# Patient Record
Sex: Female | Born: 1995 | Race: White | Hispanic: No | Marital: Married
Health system: Midwestern US, Community
[De-identification: ages and names within clinical notes are randomized; demographics above are authoritative.]

## PROBLEM LIST (undated history)

## (undated) DIAGNOSIS — F319 Bipolar disorder, unspecified: Secondary | ICD-10-CM

## (undated) DIAGNOSIS — F419 Anxiety disorder, unspecified: Secondary | ICD-10-CM

## (undated) DIAGNOSIS — T7840XA Allergy, unspecified, initial encounter: Secondary | ICD-10-CM

## (undated) DIAGNOSIS — R51 Headache: Secondary | ICD-10-CM

## (undated) HISTORY — PX: OVUM / OOCYTE RETRIEVAL: SUR1269

## (undated) HISTORY — PX: EYE SURGERY: SHX253

## (undated) NOTE — ED Provider Notes (Signed)
 Associated Order(s): Wound Closure by Adhesive; Nerve Block Formatting of this note is different from the original. Emergency Department Treatment Report  Patient: Samantha Hoffman Age: 32 y.o. Sex: female   Date of Birth: 22-Dec-1995 Admit Date: 05/03/2020 PCP: None  MRN: [id number]  CSN: [id number]    Room: PIT/PIT Time Dictated: 2:35 PM    Payor: TRICARE / Plan: [facility] [place] / Product Type: [employer] /   Chief Complaint  Chief Complaint  Patient presents with  ? Laceration   History of Present Illness  37 y.o. female otherwise healthy RIGHT hand dominant  who presents to the ER she presents to the ER status post seizure presents to the ER status post right fifth finger laceration after cutting sweet potatoes her dogs.  She is up-to-date on tetanus.  She is neurovascularly intact and sensation intact to light touch on exam.  She states symptoms have improved since onset as she currently has had controlling her bleeding.  She denies taking any medication for treatment of her symptoms prior to arrival.  Patient denies any alleviating or exacerbating factors for her symptoms.  We will give 5 mg of oxycodone, obtain x-rays of the finger in the right hand, perform a digital nerve block, explore and irrigate the wound to ensure the tendon has not been lacerated, and sutured the wound with appropriate follow-up.  Review of Systems  Review of Systems  Constitutional: Negative for chills and fever.  HENT: Negative for congestion and sore throat.   Eyes: Negative for double vision and photophobia.  Respiratory: Negative for cough, shortness of breath and wheezing.   Cardiovascular: Negative for chest pain, palpitations and leg swelling.  Gastrointestinal: Negative for abdominal pain, diarrhea, nausea and vomiting.  Genitourinary: Negative for dysuria, frequency and urgency.  Musculoskeletal: Negative for myalgias and neck pain.  Skin: Negative for rash.  Neurological: Negative for  dizziness, speech change, loss of consciousness and weakness.   Past Medical/Surgical History  None reported past medical history No past surgical history on file.  Social History   Social History   Socioeconomic History  ? Marital status: MARRIED   Denies illicit drug usage  Family History  No family history on file.  Current Medications   None   Allergies   Allergies  Allergen Reactions  ? Azithromycin Hives and Shortness of Breath  ? Bee Venom Protein (Honey Bee) Angioedema  ? Doxycycline Hives and Shortness of Breath  ? Penicillin V Hives and Shortness of Breath   Physical Exam   ED Triage Vitals [05/03/20 1639]  ED Encounter Vitals Group     BP (!) 131/94     Pulse (Heart Rate) 95     Resp Rate 18     Temp 97.2 F (36.2 C)     Temp src      O2 Sat (%) 99 %     Weight 235 lb     Height 5' 10   I have reviewed documented vital signs, which are within age-appropriate parameters.  I have reviewed nursing documentation.  Physical Exam Constitutional:      General: She is not in acute distress.    Appearance: Normal appearance. She is not ill-appearing or diaphoretic.  HENT:     Head: Normocephalic and atraumatic.     Nose: No congestion or rhinorrhea.     Mouth/Throat:     Mouth: Mucous membranes are moist.     Pharynx: No oropharyngeal exudate or posterior oropharyngeal erythema.  Eyes:  Extraocular Movements: Extraocular movements intact.  Cardiovascular:     Rate and Rhythm: Normal rate and regular rhythm.     Heart sounds: No murmur heard. No friction rub. No gallop.   Pulmonary:     Effort: No respiratory distress.     Breath sounds: No wheezing, rhonchi or rales.  Abdominal:     Palpations: Abdomen is soft.     Tenderness: There is no abdominal tenderness. There is no guarding or rebound.  Musculoskeletal:        General: Swelling, tenderness, deformity and signs of injury present.     Cervical back: Normal range of motion.      Comments: Right fifth finger laceration approximately 2 cm in length along the long axis of the right fifth finger ulnar side.  Skin:    General: Skin is warm and dry.     Findings: No rash.  Neurological:     General: No focal deficit present.     Mental Status: She is alert and oriented to person, place, and time. Mental status is at baseline.  Psychiatric:        Mood and Affect: Mood normal.   Diagnostic Studies  Lab:  No results found for this or any previous visit (from the past 12 hour(s)).  I have reviewed all ordered labs and used them in my medical decision making (MDM) as documented below.  Imaging:   XR 5TH FINGER RT MIN 2 V  Result Date: 05/03/2020 EXAM: X-ray of the fifth digit of the right hand INDICATION:  Laceration TECHNIQUE: 3 views of the fifth digit of the right hand COMPARISON: None FINDINGS: The alignment is unremarkable. No fracture or dislocation seen. The joint spaces are preserved. The bone density is grossly unremarkable. No erosions or periostitis seen. No lytic or blastic focus identified. A laceration is present. No radiopaque foreign body identified.   1.  Distal soft tissue laceration without evidence of radiopaque foreign body. 2.  No fracture or dislocation.  I have reviewed all ordered imaging studies and used them in my medical decision making (MDM) as documented below.  Procedure Notes  Nerve Block  Date/Time: 05/04/2020 2:39 PM Performed by: Sydney Ginnie HERO, MD Authorized by: Sydney Ginnie HERO, MD   Consent:    Consent obtained:  Verbal   Consent given by:  Patient   Risks discussed:  Allergic reaction, infection, nerve damage, swelling, unsuccessful block, pain, intravenous injection and bleeding   Alternatives discussed:  No treatment Indications:    Indications:  Pain relief and procedural anesthesia Location:    Body area:  Upper extremity   Laterality:  Right Pre-procedure details:    Skin preparation:  Alcohol Skin anesthesia  (see MAR for exact dosages):    Skin anesthesia method:  Local infiltration   Local anesthetic:  Lidocaine 2% w/o epi Procedure details (see MAR for exact dosages):    Block needle gauge:  25 G   Steroid injected:  None   Additive injected:  None   Injection procedure:  Anatomic landmarks identified, incremental injection, negative aspiration for blood, introduced needle and anatomic landmarks palpated   Paresthesia:  Immediately resolved Post-procedure details:    Dressing:  None   Outcome:  Anesthesia achieved   Patient tolerance of procedure:  Tolerated well, no immediate complications Comments:     Palmar digital nerve block at the base of the right fifth finger with successful anesthesia. Wound Closure by Adhesive  Date/Time: 05/04/2020 2:40 PM Performed by: Sydney Ginnie HERO,  MD Authorized by: Sydney Ginnie HERO, MD   Consent:    Consent obtained:  Verbal   Consent given by:  Patient   Risks discussed:  Infection, need for additional repair, nerve damage, poor wound healing, poor cosmetic result, pain, retained foreign body, tendon damage and vascular damage   Alternatives discussed:  No treatment Anesthesia (see MAR for exact dosages):    Anesthesia method:  Nerve block   Block location:  RIGHT fifth digit   Block needle gauge:  25 G   Block anesthetic:  Lidocaine 2% w/o epi   Block injection procedure:  Anatomic landmarks identified, introduced needle, incremental injection, negative aspiration for blood and anatomic landmarks palpated   Block outcome:  Anesthesia achieved Laceration details:    Location:  Finger   Finger location:  R small finger   Length (cm):  2   Depth (mm):  0.5 Pre-procedure details:    Preparation:  Patient was prepped and draped in usual sterile fashion and imaging obtained to evaluate for foreign bodies Exploration:    Hemostasis achieved with:  Direct pressure   Wound exploration: wound explored through full range of motion and entire depth  of wound probed and visualized     Wound extent: fascia violated and muscle damage     Wound extent: no foreign bodies/material noted and no underlying fracture noted     Contaminated: yes   Treatment:    Area cleansed with:  Betadine   Amount of cleaning:  Extensive   Irrigation solution:  Sterile saline and tap water   Irrigation volume:    Irrigation method:  Syringe and tap   Visualized foreign bodies/material removed: no   Skin repair:    Repair method:  Sutures   Suture size:  5-0   Suture material:  Prolene   Suture technique:  Simple interrupted   Number of sutures:  8 Approximation:    Approximation:  Close Post-procedure details:    Dressing:  Antibiotic ointment, non-adherent dressing and adhesive bandage   Patient tolerance of procedure:  Tolerated well, no immediate complications    Clinical Decision Tools   Medical Decision Making  Chief complaint: Right fifth finger laceration  DDX: Laceration, closed fracture, foreign body  In brief, this is a 13 year old female otherwise healthy who presents to the ER status post laceration to the ulnar aspect of the right fifth finger.  She had pain control, and x-ray that was negative for fracture or evidence of radiopaque foreign body, the wound was extensively cleansed and probed no evidence of tendon laceration.  She had a digital nerve block and the wound was repaired with 8 simple interrupted 5-0 Prolene stitches.  She tolerated the procedure well and had good pain control.  The wound was hemostatic at completion, she was neurovascularly intact.  Bacitracin was applied, nonadherent dressing and gauze.  The patient was discharged with excellent return precautions and follow-up instructions with hand surgery.  She was given a 24-hour work note to assist with wound swelling and pain control.  ED Course as of 05/04/20 1443  Wed May 03, 2020  1740 General nerve block performed without issue, attending to examine wound,  [SM]   ED Course User Index [SM] Sydney Ginnie HERO, MD   Medications  acetaminophen  (TYLENOL ) tablet 1,000 mg (1,000 mg Oral Given 05/03/20 1720)  ketorolac  (TORADOL ) injection 15 mg (15 mg IntraMUSCular Given 05/03/20 1721)  lidocaine (XYLOCAINE) 20 mg/mL (2 %) injection 200 mg (200 mg IntraDERMal Given 05/03/20 1815)  Diagnoses     ICD-10-CM ICD-9-CM  1. Laceration of left little finger without foreign body without damage to nail, initial encounter  S61.217A 883.0   Disposition  Discharged home with recommendation for close outpatient follow-up.  Follow-up Information    Follow up With Specialties Details Why Contact Info   [facility] EMERGENCY DEPT Emergency Medicine In 1 week For suture removal [address] [place]  [address] [phone]   Tanda Elouise LABOR, DO Hand Surgery Schedule an appointment as soon as possible for a visit in 1 day If symptoms worsen, As needed [address] [place] [address] [phone]   Tanda Elouise LABOR, DO Hand Surgery Schedule an appointment as soon as possible for a visit in 1 day If symptoms worsen, As needed [address] [place] [address] [place] [address] [phone]     Ginnie CHRISTELLA Mallard, MD May 04, 2020  My signature above authenticates this document and my orders, the final   diagnosis (es), discharge prescription (s), and instructions in the Epic   record. If you have any questions please contact [phone].  Nursing notes have been reviewed by the physician/ advanced practice   Clinician.  Dragon medical dictation software was used for portions of this report. Unintended voice recognition grammatical errors may occur.   =========================================================  I personally saw and examined the patient. I have reviewed and agree with the resident?s findings, including all diagnostic interpretations, and plans as written. I was present during the key portions of separately billed  procedures. Alm LABOR Slot, DO   Electronically signed by Slot Alm LABOR, DO at 05/05/2020 12:27 PM EST

## (undated) NOTE — ED Triage Notes (Signed)
 Formatting of this note might be different from the original. Pt cut her left pinky finger while making dog treats. Last tetnus August 2018.  Electronically signed by Susy Shuck, RN at 05/03/2020  4:39 PM EST

---
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
## 2017-07-07 ENCOUNTER — Emergency Department (INDEPENDENT_AMBULATORY_CARE_PROVIDER_SITE_OTHER): Payer: BLUE CROSS/BLUE SHIELD

## 2017-07-07 ENCOUNTER — Emergency Department
Admission: EM | Admit: 2017-07-07 | Discharge: 2017-07-07 | Disposition: A | Discharge status: Home / Self Care | Payer: BLUE CROSS/BLUE SHIELD | Source: Home / Self Care | Attending: Family Medicine | Admitting: Family Medicine

## 2017-07-07 ENCOUNTER — Encounter: Payer: Self-pay | Admitting: Emergency Medicine

## 2017-07-07 DIAGNOSIS — M25571 Pain in right ankle and joints of right foot: Secondary | ICD-10-CM | POA: Diagnosis not present

## 2017-07-07 DIAGNOSIS — S93401A Sprain of unspecified ligament of right ankle, initial encounter: Secondary | ICD-10-CM | POA: Diagnosis not present

## 2017-07-07 NOTE — ED Provider Notes (Signed)
[facility]    CSN: [id number] Arrival date & time: 07/07/17  1920     History   Chief Complaint Chief Complaint  Patient presents with  . Ankle Pain    HPI Samantha Hoffman is a 22 y.o. female.   Patient twisted her right ankle about 8 hours ago, and has persistent medial ankle pain.   Ankle Pain  Location:  Ankle Time since incident:  8 hours Injury: yes   Mechanism of injury comment:  Twisted ankle Ankle location:  R ankle Pain details:    Quality:  Aching   Radiates to:  Does not radiate   Severity:  Mild   Onset quality:  Sudden   Duration:  8 hours   Timing:  Constant   Progression:  Unchanged Chronicity:  New Dislocation: no   Prior injury to area:  No Relieved by:  Nothing Worsened by:  Bearing weight Ineffective treatments:  None tried Associated symptoms: stiffness   Associated symptoms: no decreased ROM, no muscle weakness, no numbness, no swelling and no tingling     Past Medical History:  Diagnosis Date  . Allergy   . Anxiety   . Bipolar 1 disorder (HCC)   . Headache(784.0)    at times    Patient Active Problem List   Diagnosis Date Noted  . Overdose of antipsychotic 05/25/2012  . Prolonged Q-T interval on ECG 05/25/2012  . 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 Oppositional defiant disorder 04/29/2011  . Bipolar affective disorder, depressed, mild (HCC) 04/29/2011  . GAD (generalized anxiety disorder) 04/24/2011    Past Surgical History:  Procedure Laterality Date  . EYE SURGERY  At 6 months and 10 yearrs   pt mother states pt had "straightening of eyes at 6months and 22yo"    OB History   None      Home Medications    Prior to Admission medications   Medication Sig Start Date End Date Taking? Authorizing Provider  buPROPion (WELLBUTRIN XL) 300 MG 24 hr tablet Take 300 mg by mouth daily.    [provider]  cyclobenzaprine (FLEXERIL) 10 MG tablet Take 1 tablet (10 mg total) by mouth 3  (three) times daily. 05/23/14   Elpidio AnisUpstill, Shari, PA-C  ibuprofen (ADVIL,MOTRIN) 600 MG tablet Take 1 tablet (600 mg total) by mouth every 6 (six) hours as needed. 07/18/13   Palumbo, April, MD  risperiDONE (RISPERDAL) 2 MG tablet Take 2 mg by mouth at bedtime.    [provider]    Family History Family History  Problem Relation Age of Onset  . Bipolar disorder Mother   . Alcohol abuse Mother   . Arthritis Mother   . Depression Mother   . Drug abuse Mother   . Mental illness Mother   . Miscarriages / [place] Mother   . Bipolar disorder Brother   . Alcohol abuse Brother   . Depression Brother   . Mental illness Brother   . Arthritis Maternal Grandmother   . Heart disease Maternal Grandfather   . Alcohol abuse Father   . Bipolar disorder Father   . Cancer Paternal Grandmother   . Depression Maternal Aunt     Social History Social History   Tobacco Use  . Smoking status: Current Some Day Smoker  . Smokeless tobacco: Never Used  Substance Use Topics  . Alcohol use: No  . Drug use: No     Allergies   Azithromycin; Cefprozil; Doxycycline; Penicillins; and Lamictal [lamotrigine]  Review of Systems Review of Systems  Musculoskeletal: Positive for stiffness.  All other systems reviewed and are negative.    Physical Exam Triage Vital Signs ED Triage Vitals [07/07/17 1939]  Enc Vitals Group     BP 133/84     Pulse Rate 86     Resp      Temp 97.8 F (36.6 C)     Temp Source Oral     SpO2 99 %     Weight 230 lb (104.3 kg)     Height      Head Circumference      Peak Flow      Pain Score 6     Pain Loc      Pain Edu?      Excl. in GC?    No data found.  Updated Vital Signs BP 133/84 (BP Location: Right Arm)   Pulse 86   Temp 97.8 F (36.6 C) (Oral)   Wt 230 lb (104.3 kg)   LMP 06/30/2017   SpO2 99%   Visual Acuity Right Eye Distance:   Left Eye Distance:   Bilateral Distance:    Right Eye Near:   Left Eye Near:    Bilateral Near:      Physical Exam  Constitutional: She appears well-developed and well-nourished. No distress.  HENT:  Head: Normocephalic.  Eyes: Pupils are equal, round, and reactive to light.  Cardiovascular: Normal rate.  Pulmonary/Chest: Effort normal.  Musculoskeletal:       Right ankle: She exhibits decreased range of motion. She exhibits no swelling, no ecchymosis, no deformity and normal pulse. Tenderness. Medial malleolus tenderness found. No lateral malleolus tenderness found. Achilles tendon normal.       Feet:  Neurological: She is alert.  Skin: Skin is warm and dry.  Nursing note and vitals reviewed.    UC Treatments / Results  Labs (all labs ordered are listed, but only abnormal results are displayed) Labs Reviewed - No data to display  EKG None Radiology No results found.  Procedures Procedures (including critical care time)  Medications Ordered in UC Medications - No data to display   Initial Impression / Assessment and Plan / UC Course  I have reviewed the triage vital signs and the nursing notes.  Pertinent labs & imaging results that were available during my care of the patient were reviewed by me and considered in my medical decision making (see chart for details).    Mild medial ankle sprain. Applied Ace wrap.  Dispensed AirCast stirrup splint. Apply ice pack for 30 minutes every 1 to 2 hours today and tomorrow.  Elevate.  Use crutches for 3 to 5 days.  Wear Ace wrap until swelling decreases.  Wear brace for about 2 to 3 weeks.  May take Ibuprofen 200mg , 4 tabs every 8 hours with food. Begin range of motion and stretching exercises in about 5 days as per instruction sheet.     Final Clinical Impressions(s) / UC Diagnoses   Final diagnoses:  Sprain of right ankle, unspecified ligament, initial encounter    ED Discharge Orders    None         Lattie Haw, MD 07/10/17 [phone]

## 2017-07-07 NOTE — Discharge Instructions (Addendum)
Apply ice pack for 30 minutes every 1 to 2 hours today and tomorrow.  Elevate.  Use crutches for 3 to 5 days.  Wear Ace wrap until swelling decreases.  Wear brace for about 2 to 3 weeks.  May take Ibuprofen 200mg , 4 tabs every 8 hours with food. Begin range of motion and stretching exercises in about 5 days as per instruction sheet.

## 2017-07-07 NOTE — ED Triage Notes (Signed)
Pt c/o right ankle pain after twisting it this am. Pain is medial and pt is walking.

## 2020-05-03 ENCOUNTER — Inpatient Hospital Stay
Admit: 2020-05-03 | Discharge: 2020-05-04 | Disposition: A | Discharge status: Home / Self Care | Payer: TRICARE (CHAMPUS) | Attending: Emergency Medicine

## 2020-05-03 ENCOUNTER — Emergency Department: Admit: 2020-05-03 | Payer: TRICARE (CHAMPUS)

## 2020-05-03 DIAGNOSIS — S61217A Laceration without foreign body of left little finger without damage to nail, initial encounter: Secondary | ICD-10-CM

## 2020-05-03 MED ORDER — KETOROLAC TROMETHAMINE 15 MG/ML INJECTION
15 mg/mL | INTRAMUSCULAR | Status: DC
Start: 2020-05-03 — End: 2020-05-03
  Administered 2020-05-03: 22:00:00 via INTRAVENOUS

## 2020-05-03 MED ORDER — KETOROLAC TROMETHAMINE 15 MG/ML INJECTION
15 mg/mL | INTRAMUSCULAR | Status: AC
Start: 2020-05-03 — End: 2020-05-03
  Administered 2020-05-03: 22:00:00 via INTRAMUSCULAR

## 2020-05-03 MED ORDER — LIDOCAINE HCL 2 % (20 MG/ML) IJ SOLN
20 mg/mL (2 %) | Freq: Once | INTRAMUSCULAR | Status: AC
Start: 2020-05-03 — End: 2020-05-03
  Administered 2020-05-03: 23:00:00 via INTRADERMAL

## 2020-05-03 MED ORDER — OXYCODONE-ACETAMINOPHEN 5 MG-325 MG TAB
5-325 mg | Freq: Once | ORAL | Status: DC
Start: 2020-05-03 — End: 2020-05-03

## 2020-05-03 MED ORDER — ACETAMINOPHEN 500 MG TAB
500 mg | ORAL | Status: AC
Start: 2020-05-03 — End: 2020-05-03
  Administered 2020-05-03: 22:00:00 via ORAL

## 2020-05-03 MED ORDER — DIPHTH,PERTUS(AC)TETANUS VAC(PF) 2.5 LF UNIT-8 MCG-5 LF/0.5 ML INJ
INTRAMUSCULAR | Status: DC
Start: 2020-05-03 — End: 2020-05-03

## 2020-05-03 MED FILL — OXYCODONE-ACETAMINOPHEN 5 MG-325 MG TAB: 5-325 mg | ORAL | Qty: 1

## 2020-05-03 MED FILL — KETOROLAC TROMETHAMINE 15 MG/ML INJECTION: 15 mg/mL | INTRAMUSCULAR | Qty: 1

## 2020-05-03 MED FILL — LIDOCAINE HCL 2 % (20 MG/ML) IJ SOLN: 20 mg/mL (2 %) | INTRAMUSCULAR | Qty: 20

## 2020-05-03 MED FILL — ACETAMINOPHEN 500 MG TAB: 500 mg | ORAL | Qty: 2

## 2020-05-03 NOTE — ED Provider Notes (Signed)
Emergency Department Treatment Report    Patient: Cassidy EdwardsShelby Wise Age: 25 y.o. Sex: female    Date of Birth: 26-May-1995 Admit Date: 05/03/2020 PCP: None   MRN: [id number]  CSN: [id number]     Room: PIT/PIT Time Dictated: 2:35 PM      Payor: TRICARE / Plan: [facility] [employer] [place] / Product Type: [employer] /     Chief Complaint   Chief Complaint   Patient presents with   ??? Laceration       History of Present Illness   25 y.o. female otherwise healthy RIGHT hand dominant  who presents to the ER she presents to the ER status post seizure presents to the ER status post right fifth finger laceration after cutting sweet potatoes her dogs.  She is up-to-date on tetanus.    She is neurovascularly intact and sensation intact to light touch on exam.    She states symptoms have improved since onset as she currently has had controlling her bleeding.  She denies taking any medication for treatment of her symptoms prior to arrival.  Patient denies any alleviating or exacerbating factors for her symptoms.    We will give 5 mg of oxycodone, obtain x-rays of the finger in the right hand, perform a digital nerve block, explore and irrigate the wound to ensure the tendon has not been lacerated, and sutured the wound with appropriate follow-up.    Review of Systems   Review of Systems   Constitutional: Negative for chills and fever.   HENT: Negative for congestion and sore throat.    Eyes: Negative for double vision and photophobia.   Respiratory: Negative for cough, shortness of breath and wheezing.    Cardiovascular: Negative for chest pain, palpitations and leg swelling.   Gastrointestinal: Negative for abdominal pain, diarrhea, nausea and vomiting.   Genitourinary: Negative for dysuria, frequency and urgency.   Musculoskeletal: Negative for myalgias and neck pain.   Skin: Negative for rash.   Neurological: Negative for dizziness, speech change, loss of consciousness and weakness.       Past Medical/Surgical History   None reported  past medical history  No past surgical history on file.    Social History     Social History     Socioeconomic History   ??? Marital status: MARRIED     Denies illicit drug usage    Family History   No family history on file.    Current Medications     None       Allergies     Allergies   Allergen Reactions   ??? Azithromycin Hives and Shortness of Breath   ??? Bee Venom Protein (Honey Bee) Angioedema   ??? Doxycycline Hives and Shortness of Breath   ??? Penicillin V Hives and Shortness of Breath       Physical Exam     ED Triage Vitals [05/03/20 1639]   ED Encounter Vitals Group      BP (!) 131/94      Pulse (Heart Rate) 95      Resp Rate 18      Temp 97.2 ??F (36.2 ??C)      Temp src       O2 Sat (%) 99 %      Weight 235 lb      Height 5\' 10"        I have reviewed documented vital signs, which are within age-appropriate parameters.   I have reviewed nursing documentation.    Physical Exam  Constitutional:       General: She is not in acute distress.     Appearance: Normal appearance. She is not ill-appearing or diaphoretic.   HENT:      Head: Normocephalic and atraumatic.      Nose: No congestion or rhinorrhea.      Mouth/Throat:      Mouth: Mucous membranes are moist.      Pharynx: No oropharyngeal exudate or posterior oropharyngeal erythema.   Eyes:      Extraocular Movements: Extraocular movements intact.   Cardiovascular:      Rate and Rhythm: Normal rate and regular rhythm.      Heart sounds: No murmur heard.  No friction rub. No gallop.    Pulmonary:      Effort: No respiratory distress.      Breath sounds: No wheezing, rhonchi or rales.   Abdominal:      Palpations: Abdomen is soft.      Tenderness: There is no abdominal tenderness. There is no guarding or rebound.   Musculoskeletal:         General: Swelling, tenderness, deformity and signs of injury present.      Cervical back: Normal range of motion.      Comments: Right fifth finger laceration approximately 2 cm in length along the long axis of the right fifth  finger ulnar side.   Skin:     General: Skin is warm and dry.      Findings: No rash.   Neurological:      General: No focal deficit present.      Mental Status: She is alert and oriented to person, place, and time. Mental status is at baseline.   Psychiatric:         Mood and Affect: Mood normal.         Diagnostic Studies   Lab:   No results found for this or any previous visit (from the past 12 hour(s)).      I have reviewed all ordered labs and used them in my medical decision making (MDM) as documented below.    Imaging:    XR 5TH FINGER RT MIN 2 V    Result Date: 05/03/2020  EXAM: X-ray of the fifth digit of the right hand INDICATION:  Laceration TECHNIQUE: 3 views of the fifth digit of the right hand COMPARISON: None FINDINGS: The alignment is unremarkable. No fracture or dislocation seen. The joint spaces are preserved. The bone density is grossly unremarkable. No erosions or periostitis seen. No lytic or blastic focus identified. A laceration is present. No radiopaque foreign body identified.     1.  Distal soft tissue laceration without evidence of radiopaque foreign body. 2.  No fracture or dislocation.    I have reviewed all ordered imaging studies and used them in my medical decision making (MDM) as documented below.    Procedure Notes   Nerve Block    Date/Time: 05/04/2020 2:39 PM  Performed by: Rosemarie Beath, MD  Authorized by: Rosemarie Beath, MD     Consent:     Consent obtained:  Verbal    Consent given by:  Patient    Risks discussed:  Allergic reaction, infection, nerve damage, swelling, unsuccessful block, pain, intravenous injection and bleeding    Alternatives discussed:  No treatment  Indications:     Indications:  Pain relief and procedural anesthesia  Location:     Body area:  Upper extremity    Laterality:  Right  Pre-procedure details:     Skin preparation:  Alcohol  Skin anesthesia (see MAR for exact dosages):     Skin anesthesia method:  Local infiltration    Local anesthetic:   Lidocaine 2% w/o epi  Procedure details (see MAR for exact dosages):     Block needle gauge:  25 G    Steroid injected:  None    Additive injected:  None    Injection procedure:  Anatomic landmarks identified, incremental injection, negative aspiration for blood, introduced needle and anatomic landmarks palpated    Paresthesia:  Immediately resolved  Post-procedure details:     Dressing:  None    Outcome:  Anesthesia achieved    Patient tolerance of procedure:  Tolerated well, no immediate complications  Comments:      Palmar digital nerve block at the base of the right fifth finger with successful anesthesia.  Wound Closure by Adhesive    Date/Time: 05/04/2020 2:40 PM  Performed by: Rosemarie Beath, MD  Authorized by: Rosemarie Beath, MD     Consent:     Consent obtained:  Verbal    Consent given by:  Patient    Risks discussed:  Infection, need for additional repair, nerve damage, poor wound healing, poor cosmetic result, pain, retained foreign body, tendon damage and vascular damage    Alternatives discussed:  No treatment  Anesthesia (see MAR for exact dosages):     Anesthesia method:  Nerve block    Block location:  RIGHT fifth digit    Block needle gauge:  25 G    Block anesthetic:  Lidocaine 2% w/o epi    Block injection procedure:  Anatomic landmarks identified, introduced needle, incremental injection, negative aspiration for blood and anatomic landmarks palpated    Block outcome:  Anesthesia achieved  Laceration details:     Location:  Finger    Finger location:  R small finger    Length (cm):  2    Depth (mm):  0.5  Pre-procedure details:     Preparation:  Patient was prepped and draped in usual sterile fashion and imaging obtained to evaluate for foreign bodies  Exploration:     Hemostasis achieved with:  Direct pressure    Wound exploration: wound explored through full range of motion and entire depth of wound probed and visualized      Wound extent: fascia violated and muscle damage      Wound  extent: no foreign bodies/material noted and no underlying fracture noted      Contaminated: yes    Treatment:     Area cleansed with:  Betadine    Amount of cleaning:  Extensive    Irrigation solution:  Sterile saline and tap water    Irrigation volume:     Irrigation method:  Syringe and tap    Visualized foreign bodies/material removed: no    Skin repair:     Repair method:  Sutures    Suture size:  5-0    Suture material:  Prolene    Suture technique:  Simple interrupted    Number of sutures:  8  Approximation:     Approximation:  Close  Post-procedure details:     Dressing:  Antibiotic ointment, non-adherent dressing and adhesive bandage    Patient tolerance of procedure:  Tolerated well, no immediate complications         Clinical Decision Tools       Medical Decision Making   Chief complaint: Right fifth finger laceration  DDX: Laceration, closed fracture, foreign body    In brief, this is a 25 year old female otherwise healthy who presents to the ER status post laceration to the ulnar aspect of the right fifth finger.  She had pain control, and x-ray that was negative for fracture or evidence of radiopaque foreign body, the wound was extensively cleansed and probed no evidence of tendon laceration.  She had a digital nerve block and the wound was repaired with 8 simple interrupted 5-0 Prolene stitches.  She tolerated the procedure well and had good pain control.  The wound was hemostatic at completion, she was neurovascularly intact.  Bacitracin was applied, nonadherent dressing and gauze.    The patient was discharged with excellent return precautions and follow-up instructions with hand surgery.  She was given a 24-hour work note to assist with wound swelling and pain control.    ED Course as of 05/04/20 1443   Wed May 03, 2020   1740 General nerve block performed without issue, attending to examine wound, [SM]      ED Course User Index  [SM] Rosemarie Beath, MD       Medications   acetaminophen  (TYLENOL) tablet 1,000 mg (1,000 mg Oral Given 05/03/20 1720)   ketorolac (TORADOL) injection 15 mg (15 mg IntraMUSCular Given 05/03/20 1721)   lidocaine (XYLOCAINE) 20 mg/mL (2 %) injection 200 mg (200 mg IntraDERMal Given 05/03/20 1815)         Diagnoses       ICD-10-CM ICD-9-CM   1. Laceration of left little finger without foreign body without damage to nail, initial encounter  S61.217A 883.0       Disposition   Discharged home with recommendation for close outpatient follow-up.      Follow-up Information     Follow up With Specialties Details Why Contact Info    [facility] EMERGENCY DEPT Emergency Medicine In 1 week For suture removal [address]  [place] [address]  [phone]    Peggyann Shoals, DO Hand Surgery Schedule an appointment as soon as possible for a visit in 1 day If symptoms worsen, As needed [address]  [place] [address]  [phone]      Peggyann Shoals, DO Hand Surgery Schedule an appointment as soon as possible for a visit in 1 day If symptoms worsen, As needed [address] [place]  [address]  [place] [address]  [phone]             Rosemarie Beath, MD  May 04, 2020    My signature above authenticates this document and my orders, the final    diagnosis (es), discharge prescription (s), and instructions in the Epic    record.  If you have any questions please contact [phone].     Nursing notes have been reviewed by the physician/ advanced practice    Clinician.    Dragon medical dictation software was used for portions of this report. Unintended voice recognition grammatical errors may occur.         =========================================================    I personally saw and examined the patient. I have reviewed and agree with the resident???s findings, including all diagnostic interpretations, and plans as written. I was present during the key portions of separately billed procedures.  Loney Loh, DO

## 2020-05-03 NOTE — ED Notes (Signed)
Pt cut her left pinky finger while making dog treats. Last tetnus August 2018.

## 2020-05-03 NOTE — ED Notes (Deleted)
I performed a brief evaluation, including history and physical, of the patient here in triage and I have determined that pt will need further treatment and evaluation from the main side ER physician.  I have placed initial orders to help in expediting patients care.     May 03, 2020 at 4:40 PM - Joaquim Lai, PA        Injury to right fifth finger PTA. Right handed. UTD on tetanus

## 2020-11-29 ENCOUNTER — Inpatient Hospital Stay (HOSPITAL_COMMUNITY)

## 2020-11-29 ENCOUNTER — Inpatient Hospital Stay (HOSPITAL_COMMUNITY)
Admission: AD | Admit: 2020-11-29 | Discharge: 2020-11-30 | Disposition: A | Discharge status: Home / Self Care | Attending: Obstetrics & Gynecology | Admitting: Obstetrics & Gynecology

## 2020-11-29 ENCOUNTER — Encounter (HOSPITAL_COMMUNITY): Payer: Self-pay | Admitting: Obstetrics & Gynecology

## 2020-11-29 DIAGNOSIS — R109 Unspecified abdominal pain: Secondary | ICD-10-CM | POA: Insufficient documentation

## 2020-11-29 DIAGNOSIS — Z881 Allergy status to other antibiotic agents status: Secondary | ICD-10-CM | POA: Insufficient documentation

## 2020-11-29 DIAGNOSIS — O4691 Antepartum hemorrhage, unspecified, first trimester: Secondary | ICD-10-CM | POA: Diagnosis not present

## 2020-11-29 DIAGNOSIS — Z79899 Other long term (current) drug therapy: Secondary | ICD-10-CM | POA: Diagnosis not present

## 2020-11-29 DIAGNOSIS — Z3A01 Less than 8 weeks gestation of pregnancy: Secondary | ICD-10-CM | POA: Diagnosis not present

## 2020-11-29 DIAGNOSIS — Z880 Allergy status to penicillin: Secondary | ICD-10-CM | POA: Insufficient documentation

## 2020-11-29 DIAGNOSIS — O209 Hemorrhage in early pregnancy, unspecified: Secondary | ICD-10-CM | POA: Insufficient documentation

## 2020-11-29 DIAGNOSIS — Z7984 Long term (current) use of oral hypoglycemic drugs: Secondary | ICD-10-CM | POA: Insufficient documentation

## 2020-11-29 DIAGNOSIS — Z7952 Long term (current) use of systemic steroids: Secondary | ICD-10-CM | POA: Diagnosis not present

## 2020-11-29 DIAGNOSIS — O09811 Supervision of pregnancy resulting from assisted reproductive technology, first trimester: Secondary | ICD-10-CM

## 2020-11-29 DIAGNOSIS — O26891 Other specified pregnancy related conditions, first trimester: Secondary | ICD-10-CM | POA: Diagnosis not present

## 2020-11-29 LAB — CBC
HCT: 41.8 % (ref 36.0–46.0)
Hemoglobin: 14.2 g/dL (ref 12.0–15.0)
MCH: 31.9 pg (ref 26.0–34.0)
MCHC: 34 g/dL (ref 30.0–36.0)
MCV: 93.9 fL (ref 80.0–100.0)
Platelets: 311 10*3/uL (ref 150–400)
RBC: 4.45 MIL/uL (ref 3.87–5.11)
RDW: 12.3 % (ref 11.5–15.5)
WBC: 14.4 10*3/uL — ABNORMAL HIGH (ref 4.0–10.5)
nRBC: 0 % (ref 0.0–0.2)

## 2020-11-29 LAB — POCT PREGNANCY, URINE: Preg Test, Ur: POSITIVE — AB

## 2020-11-29 NOTE — MAU Provider Note (Signed)
Chief Complaint: Vaginal Bleeding and Abdominal Pain   Event Date/Time   First Provider Initiated Contact with Patient 11/29/20 2203        SUBJECTIVE HPI: Samantha Hoffman is a 25 y.o. G2P0010 at [redacted]w[redacted]d by LMP who presents to maternity admissions reporting vaginal bleeding which started at 4pm.  History is remarkable for embryo transfer in August. IVF was done in [place] but her pregnancy is managed by her doctor in [place].  Had an [place] 2 days ago which showed a Gestational Sac and yolk sac.  . She denies urinary symptoms, h/a, dizziness, n/v, or fever/chills.    Vaginal Bleeding The patient's primary symptoms include pelvic pain and vaginal bleeding. The patient's pertinent negatives include no genital itching, genital lesions or genital odor. This is a new problem. The current episode started today. She is pregnant. Pertinent negatives include no chills or fever. The vaginal discharge was bloody. The vaginal bleeding is lighter than menses. She has not been passing clots. She has not been passing tissue. Nothing aggravates the symptoms. She has tried nothing for the symptoms.   RN Note: Samantha Hoffman is a 25 y.o. at [redacted]w[redacted]d here in MAU reporting: Vaginal bleeding that began today around 4pm. It has gotten worse and she has changed her pad/ every hour, the pad has not been saturated, more like 4th day of period. Reports she has had abdominal cramping that has stayed the same since [redacted]w[redacted]d. She had a 5day embryo transfer on August 22nd.  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 [employer]  Tobacco Use   Smoking status: Some Days   Smokeless tobacco: Never  Vaping Use   Vaping Use: Never used  Substance and Sexual Activity   Alcohol use: No   Drug use: No   Sexual activity: Never  Other Topics Concern   Not on file  Social History Narrative   Not on file   Social Determinants of Health   Financial Resource Strain: Not on file  Food Insecurity: Not on file  Transportation Needs: Not on file  Physical Activity: Not on file  Stress: Not on file  Social Connections: Not on file  Intimate Partner Violence: Not on file   No current facility-administered medications on file prior to encounter.   Current Outpatient Medications on File Prior to Encounter  Medication Sig Dispense Refill   estradiol (ESTRACE) 2 MG tablet Take 2 mg by mouth in the morning, at noon, and at bedtime.     levothyroxine (SYNTHROID) 25 MCG tablet Take 25 mcg by mouth daily before breakfast.     metFORMIN (GLUCOPHAGE) 1000 MG tablet Take 500 mg by mouth in the morning, at noon, and at bedtime.     naltrexone (DEPADE) 50 MG tablet Take  3 mg by mouth daily.     predniSONE (DELTASONE) 5 MG tablet Take 5 mg by mouth 2 (two) times daily with a meal.     prenatal vitamin w/FE, FA (PRENATAL 1 + 1) 27-1 MG TABS tablet Take 1 tablet by mouth daily at 12 noon.     progesterone (PROMETRIUM) 200 MG capsule Take 200 mg by mouth daily.     progesterone 50 MG/ML injection Inject 75 mg into the muscle daily.     buPROPion (WELLBUTRIN XL) 300 MG 24 hr tablet Take 300 mg by mouth daily.     cyclobenzaprine (FLEXERIL) 10 MG tablet Take 1 tablet (10 mg total) by mouth 3 (three) times daily. 15 tablet 0   ibuprofen (ADVIL,MOTRIN) 600 MG tablet Take 1 tablet (600 mg total) by mouth every 6 (six) hours as needed. 30 tablet 0   risperiDONE (RISPERDAL) 2 MG tablet Take 2  mg by mouth at bedtime.     Allergies  Allergen Reactions   Azithromycin Hives   Cefprozil Hives   Doxycycline Hives   Penicillins Hives   Lamictal [Lamotrigine] Rash    I have reviewed patient's Past Medical Hx, Surgical Hx, Family Hx, Social Hx, medications and allergies.   ROS:  Review of Systems  Constitutional:  Negative for chills and fever.  Respiratory:  Negative for shortness of breath.   Genitourinary:  Positive for pelvic pain and vaginal bleeding.  Neurological:  Negative for weakness.  Review of Systems  Other systems negative   Physical Exam  Physical Exam Patient Vitals for the past 24 hrs:  BP Temp Temp src Pulse Resp SpO2 Height Weight  11/29/20 2142 132/71 98.1 F (36.7 C) Oral 98 17 100 % 5\' 10"  (1.778 m) 119.2 kg   Constitutional: Well-developed, well-nourished female in no acute distress.  Cardiovascular: normal rate Respiratory: normal effort GI: Abd soft, non-tender.  MS: Extremities nontender, no edema, normal ROM Neurologic: Alert and oriented x 4.  GU: Neg CVAT.  PELVIC EXAM: Cervix pink, visually closed, without lesion, scant red/brown discharge, vaginal walls and external genitalia normal   LAB RESULTS Results for orders placed or performed during the hospital encounter of 11/29/20 (from the past 24 hour(s))  Pregnancy, urine POC     Status: Abnormal   Collection Time: 11/29/20  9:11 PM  Result Value Ref Range   Preg Test, Ur POSITIVE (A) NEGATIVE  CBC     Status: Abnormal   Collection Time: 11/29/20 10:08 PM  Result Value Ref Range   WBC 14.4 (H) 4.0 - 10.5 K/uL   RBC 4.45 3.87 - 5.11 MIL/uL   Hemoglobin 14.2 12.0 - 15.0 g/dL   HCT 12/01/20 46.2 - 70.3 %   MCV 93.9 80.0 - 100.0 fL   MCH 31.9 26.0 - 34.0 pg   MCHC 34.0 30.0 - 36.0 g/dL   RDW 50.0 93.8 - 18.2 %   Platelets 311 150 - 400 K/uL   nRBC 0.0 0.0 - 0.2 %  Comprehensive metabolic panel     Status: Abnormal   Collection Time: 11/29/20 10:08 PM  Result Value Ref Range    Sodium 137 135 - 145 mmol/L   Potassium 3.7 3.5 - 5.1 mmol/L   Chloride 102 98 - 111 mmol/L   CO2 25 22 - 32 mmol/L   Glucose, Bld 92 70 - 99 mg/dL   BUN 11 6 - 20 mg/dL   Creatinine, Ser 12/01/20 0.44 - 1.00 mg/dL   Calcium 9.6 8.9 - 7.16 mg/dL  Total Protein 7.0 6.5 - 8.1 g/dL   Albumin 3.4 (L) 3.5 - 5.0 g/dL   AST 18 15 - 41 U/L   ALT 20 0 - 44 U/L   Alkaline Phosphatase 48 38 - 126 U/L   Total Bilirubin 0.6 0.3 - 1.2 mg/dL   GFR, Estimated >74 >94 mL/min   Anion gap 10 5 - 15  hCG, quantitative, pregnancy     Status: Abnormal   Collection Time: 11/29/20 10:08 PM  Result Value Ref Range   hCG, Beta Chain, Quant, S 17,290 (H) <5 mIU/mL  HIV Antibody (routine testing w rflx)     Status: None   Collection Time: 11/29/20 10:08 PM  Result Value Ref Range   HIV Screen 4th Generation wRfx Non Reactive Non Reactive       IMAGING US OB LESS THAN 14 WEEKS WITH OB TRANSVAGINAL  Result Date: 11/29/2020 CLINICAL DATA:  Cramps with vaginal bleeding. EXAM: OBSTETRIC <14 WK [place] AND TRANSVAGINAL OB US TECHNIQUE: Both transabdominal and transvaginal ultrasound examinations were performed for complete evaluation of the gestation as well as the maternal uterus, adnexal regions, and pelvic cul-de-sac. Transvaginal technique was performed to assess early pregnancy. COMPARISON:  None. FINDINGS: Intrauterine gestational sac: Single Yolk sac:  Visualized. Embryo:  Not Visualized. Cardiac Activity: Not Visualized. Heart Rate: N/A  bpm MSD: 10.5 mm mm   5 w   5 d Subchorionic hemorrhage:  None visualized. Maternal uterus/adnexae: The right and left ovaries are not visualized. No pelvic free fluid is seen. IMPRESSION: Probable early intrauterine gestational sac, but no yolk sac, fetal pole, or cardiac activity yet visualized. Recommend follow-up quantitative B-HCG levels and follow-up US in 14 days to assess viability. This recommendation follows SRU consensus guidelines: Diagnostic Criteria for Nonviable  Pregnancy Early in the First Trimester. Malva Limes Med 2013; 496:7591-63. Electronically Signed   By: Aram Candela M.D.   On: 11/29/2020 22:54     MAU Management/MDM: Ordered usual first trimester r/o ectopic labs.   Pelvic exam done Will check Ultrasound to rule out SAB  This bleeding/pain can represent a normal pregnancy with bleeding, spontaneous abortion or even an ectopic which can be life-threatening.  The process as listed above helps to determine which of these is present.  Revewed US findings which are consistent with one she had at her office Discussed we can only say that the pregnancy is still in uterus We cannot determine the ultimate result by this exam  ASSESSMENT Pregnancy at [redacted]w[redacted]d IVF pregnancy Bleeding in the first trimester  PLAN Discharge home Bleeding precautions Has appt on Friday Pt stable at time of discharge. Encouraged to return here if she develops worsening of symptoms, increase in pain, fever, or other concerning symptoms.    Wynelle Bourgeois CNM, MSN Certified Nurse-Midwife 11/29/2020  10:03 PM

## 2020-11-29 NOTE — MAU Note (Signed)
..  Samantha Hoffman is a 25 y.o. at [redacted]w[redacted]d here in MAU reporting: Vaginal bleeding that began today around 4pm. It has gotten worse and she has changed her pad/ every hour, the pad has not been saturated, more like 4th day of period. Reports she has had abdominal cramping that has stayed the same since [redacted]w[redacted]d. She had a 5day embryo transfer on August 22nd.  Pain score: 4/10 Vitals:   11/29/20 2142  BP: 132/71  Pulse: 98  Resp: 17  Temp: 98.1 F (36.7 C)  SpO2: 100%

## 2020-11-30 LAB — COMPREHENSIVE METABOLIC PANEL
ALT: 20 U/L (ref 0–44)
AST: 18 U/L (ref 15–41)
Albumin: 3.4 g/dL — ABNORMAL LOW (ref 3.5–5.0)
Alkaline Phosphatase: 48 U/L (ref 38–126)
Anion gap: 10 (ref 5–15)
BUN: 11 mg/dL (ref 6–20)
CO2: 25 mmol/L (ref 22–32)
Calcium: 9.6 mg/dL (ref 8.9–10.3)
Chloride: 102 mmol/L (ref 98–111)
Creatinine, Ser: 0.94 mg/dL (ref 0.44–1.00)
GFR, Estimated: >60 mL/min (ref >60)
Glucose, Bld: 92 mg/dL (ref 70–99)
Potassium: 3.7 mmol/L (ref 3.5–5.1)
Sodium: 137 mmol/L (ref 135–145)
Total Bilirubin: 0.6 mg/dL (ref 0.3–1.2)
Total Protein: 7 g/dL (ref 6.5–8.1)

## 2020-11-30 LAB — HCG, QUANTITATIVE, PREGNANCY: hCG, Beta Chain, Quant, S: 17290 m[IU]/mL — ABNORMAL HIGH (ref <5)

## 2020-11-30 LAB — HIV ANTIBODY (ROUTINE TESTING W REFLEX): HIV Screen 4th Generation wRfx: NONREACTIVE

## 2021-07-03 DIAGNOSIS — E032 Hypothyroidism due to medicaments and other exogenous substances: Secondary | ICD-10-CM | POA: Insufficient documentation

## 2021-07-03 DIAGNOSIS — F3181 Bipolar II disorder: Secondary | ICD-10-CM | POA: Insufficient documentation

## 2021-07-15 DIAGNOSIS — O339 Maternal care for disproportion, unspecified: Secondary | ICD-10-CM | POA: Insufficient documentation

## 2023-01-01 IMAGING — US US OB < 14 WEEKS - US OB TV
1 series · 15 of 28 positions shown · non-contrast
Comparison: None.

CLINICAL DATA: Cramps with vaginal bleeding.

EXAM:
OBSTETRIC <14 WK US AND TRANSVAGINAL OB US
TECHNIQUE: Both transabdominal and transvaginal ultrasound examinations were
performed for complete evaluation of the gestation as well as the
maternal uterus, adnexal regions, and pelvic cul-de-sac.
Transvaginal technique was performed to assess early pregnancy.

[Series 1: us ob < 14 weeks - us ob tv · 38 acquisitions, 15 frames shown]
[im 1/38]
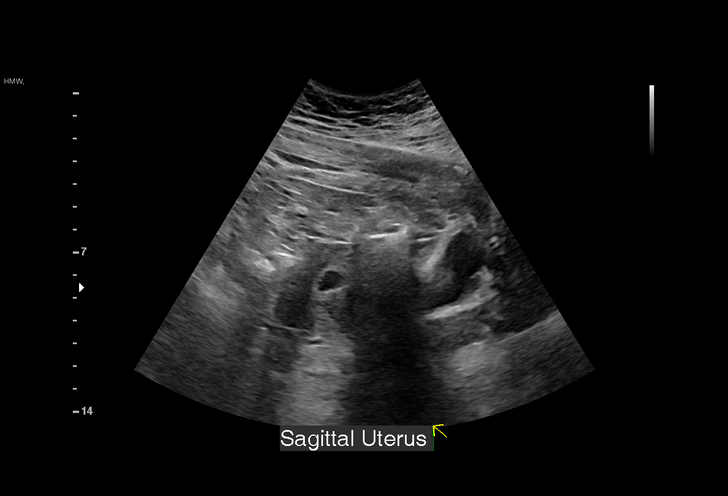
[im 3/38]
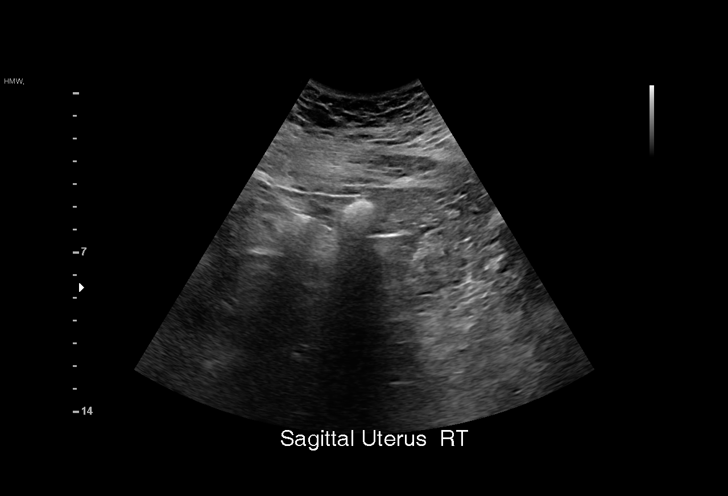
[im 6/38]
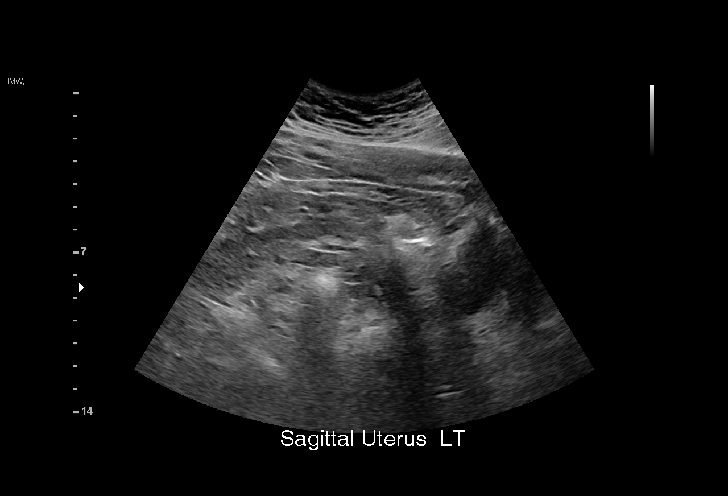
[im 9/38]
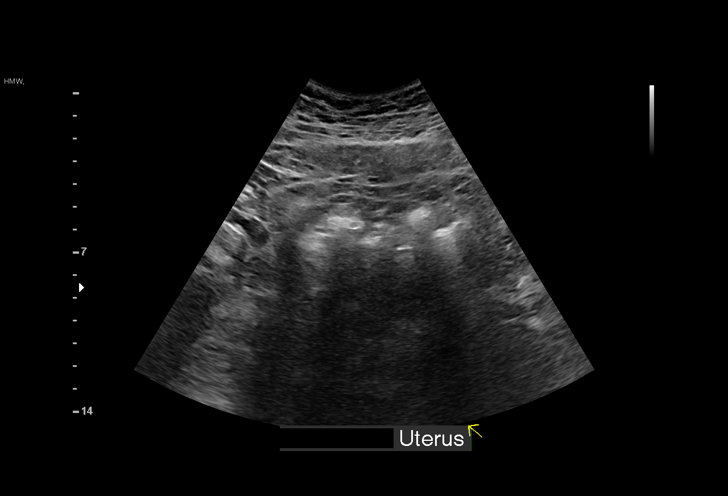
[im 11/38]
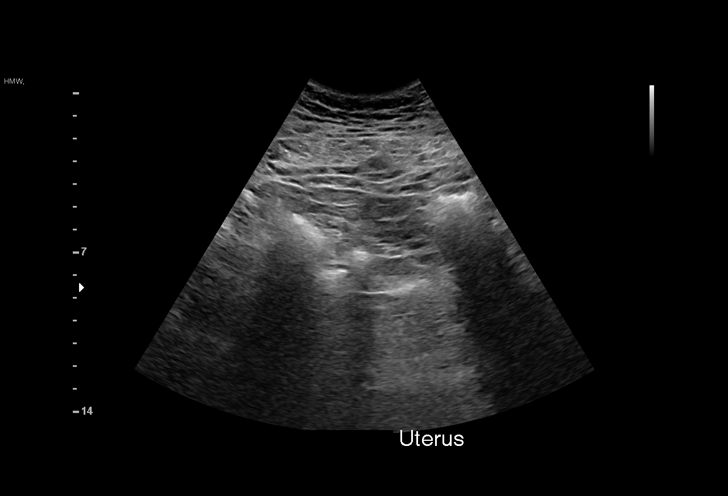
[im 14/38]
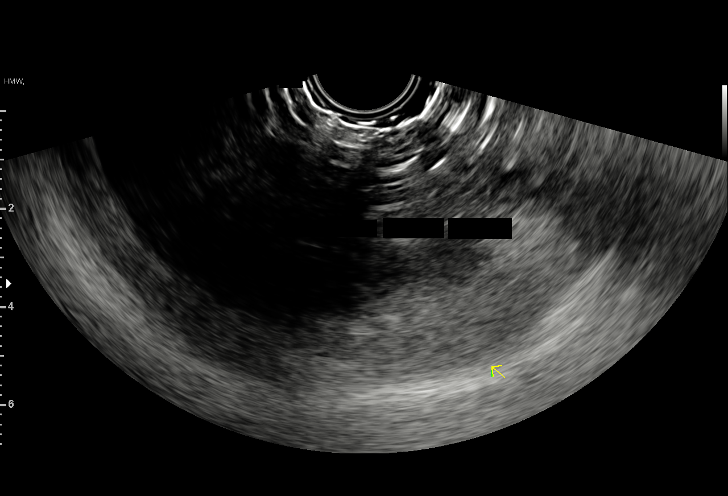
[im 17/38]
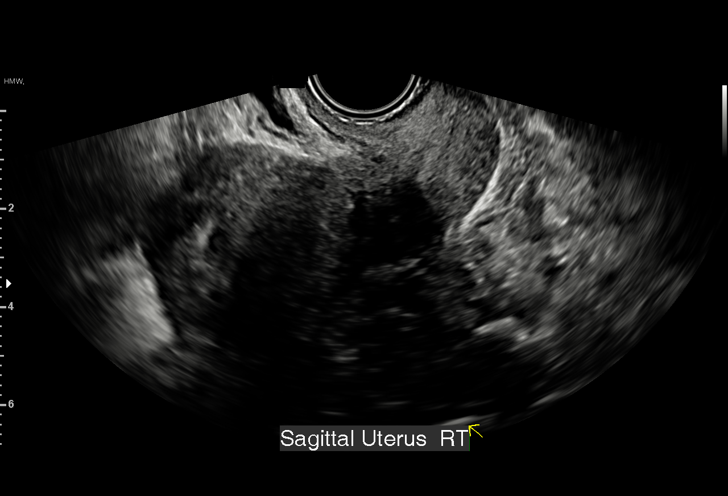
[im 20/38]
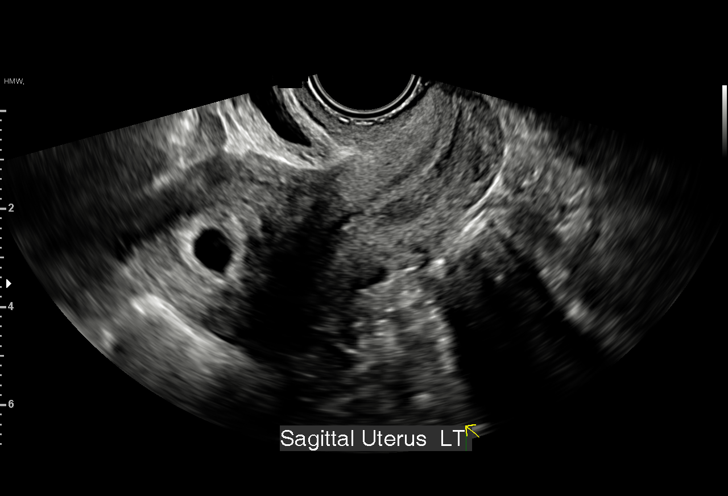
[im 21/38]
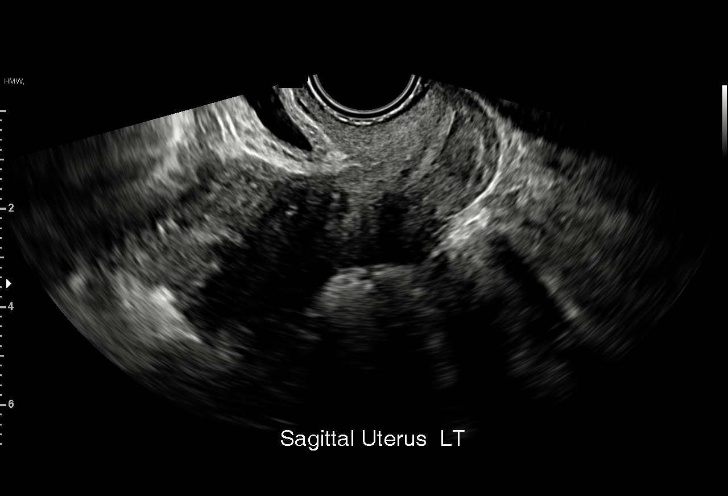
[im 24/38]
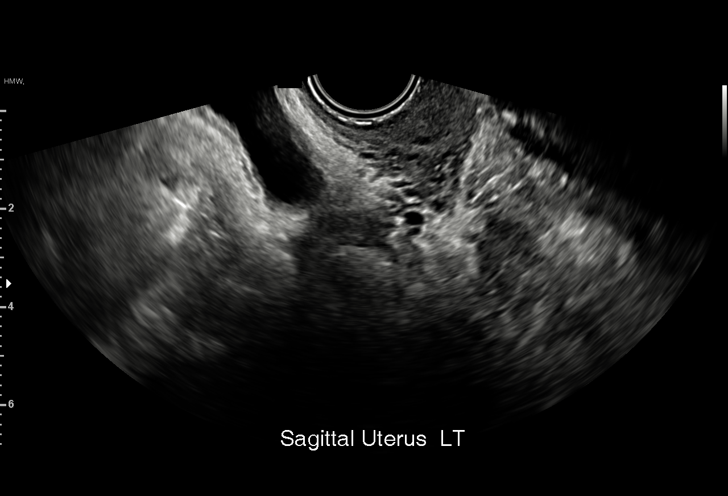
[im 27/38]
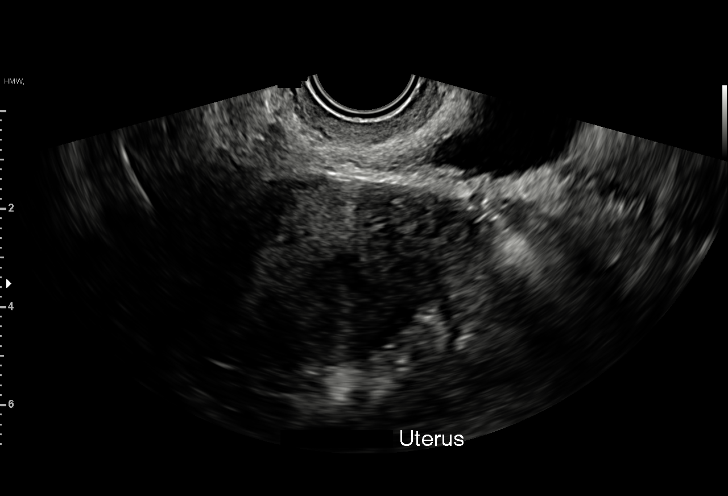
[im 29/38]
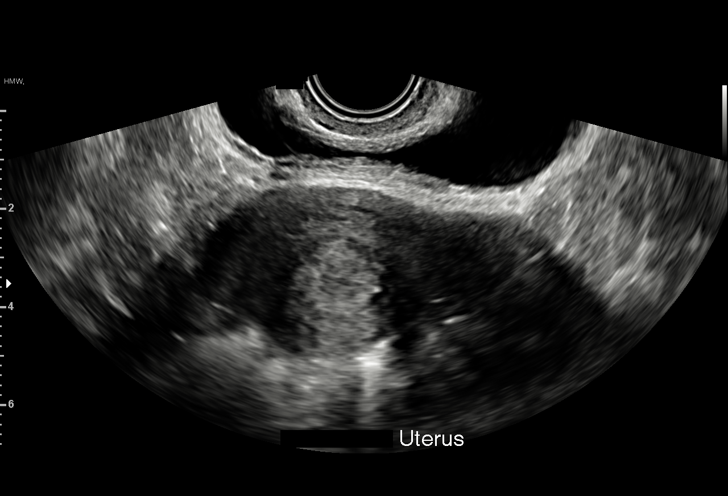
[im 32/38]
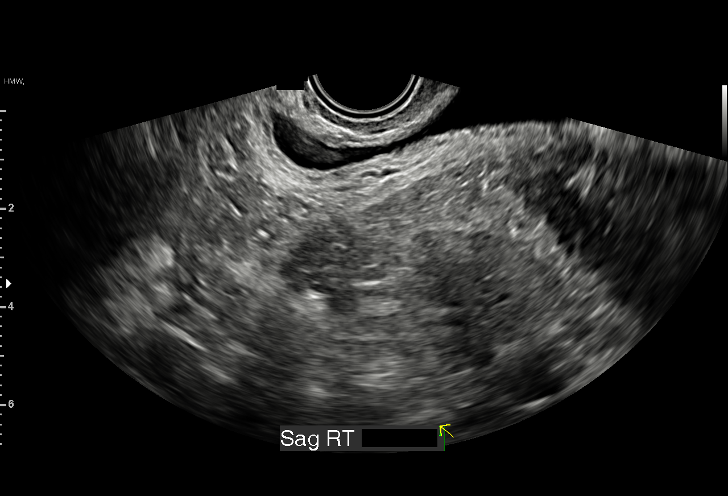
[im 35/38]
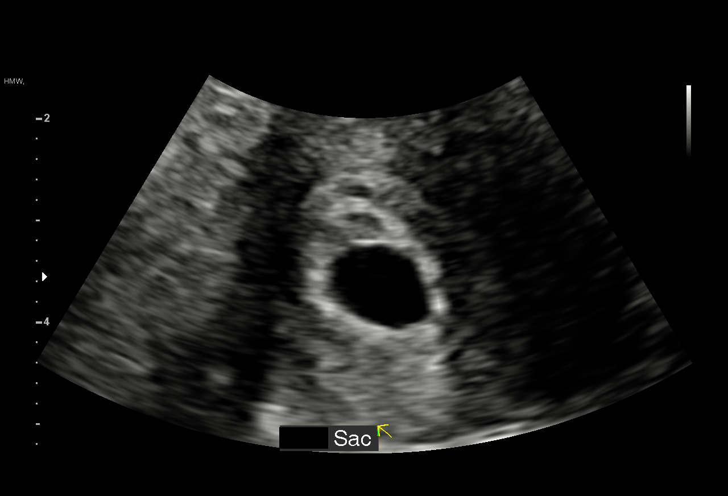
[im 38/38]
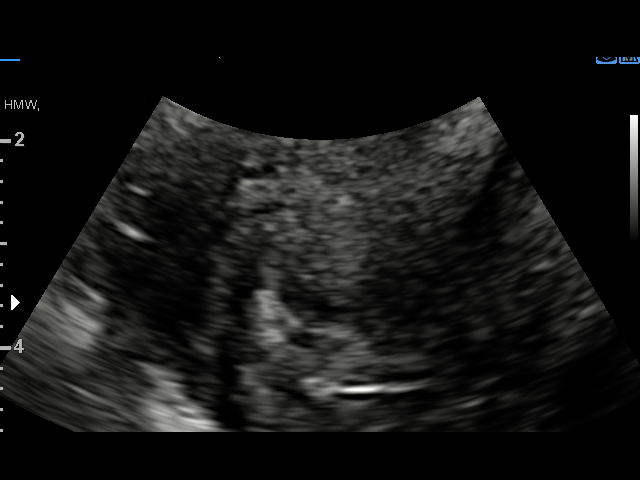

[15 of 28 positions shown; findings below may reference images not displayed]

FINDINGS: Intrauterine gestational sac: Single

Yolk sac:  Visualized.

Embryo:  Not Visualized.

Cardiac Activity: Not Visualized.

Heart Rate: N/A  bpm

MSD: 10.5 mm mm   5 w   5 d

Subchorionic hemorrhage:  None visualized.

Maternal uterus/adnexae: The right and left ovaries are not
visualized.

No pelvic free fluid is seen.
IMPRESSION: Probable early intrauterine gestational sac, but no yolk sac, fetal
pole, or cardiac activity yet visualized. Recommend follow-up
quantitative B-HCG levels and follow-up US in 14 days to assess
viability. This recommendation follows SRU consensus guidelines:
Diagnostic Criteria for Nonviable Pregnancy Early in the First
Trimester. N Engl J Med 4128; [DATE].

## 2023-10-02 ENCOUNTER — Ambulatory Visit (INDEPENDENT_AMBULATORY_CARE_PROVIDER_SITE_OTHER): Payer: Self-pay | Admitting: Podiatry

## 2023-10-02 ENCOUNTER — Encounter: Payer: Self-pay | Admitting: Podiatry

## 2023-10-02 DIAGNOSIS — L600 Ingrowing nail: Secondary | ICD-10-CM

## 2023-10-02 DIAGNOSIS — J40 Bronchitis, not specified as acute or chronic: Secondary | ICD-10-CM | POA: Insufficient documentation

## 2023-10-02 DIAGNOSIS — J189 Pneumonia, unspecified organism: Secondary | ICD-10-CM | POA: Insufficient documentation

## 2023-10-02 MED ORDER — NEOMYCIN-POLYMYXIN-HC 1 % OT SOLN: OTIC | 1 refills | Status: DC

## 2023-10-02 NOTE — Patient Instructions (Signed)

## 2023-10-02 NOTE — Progress Notes (Signed)
  Subjective:  Patient ID: Samantha Hoffman, female    DOB: 09-Mar-1996,  MRN: [id number] HPI Chief Complaint  Patient presents with   Toe Pain    Hallux left - lateral border, ingrown, red, draining x few days, using antibiotic cream   New Patient (Initial Visit)    28 y.o. female presents with the above complaint.   ROS: Denies fever chills nausea mobic muscle aches pains calf pain back pain chest pain shortness of breath.  Past Medical History:  Diagnosis Date   Allergy    Anxiety    Bipolar 1 disorder (HCC)    Headache(784.0)    at times   Past Surgical History:  Procedure Laterality Date   EYE SURGERY  At 6 months and 10 yearrs   pt mother states pt had straightening of eyes at 6months and 28yo   OVUM / OOCYTE RETRIEVAL     No current outpatient medications on file.  Allergies  Allergen Reactions   Bee Venom Anaphylaxis   Cephalosporins Hives   Azithromycin Hives   Cefprozil Hives   Doxycycline Hives   Iodinated Contrast Media    Penicillins Hives   Lamictal  [Lamotrigine ] Rash   Review of Systems Objective:  There were no vitals filed for this visit.  General: Well developed, nourished, in no acute distress, alert and oriented x3   Dermatological: Skin is warm, dry and supple bilateral. Nails x 10 are well maintained; remaining integument appears unremarkable at this time. There are no open sores, no preulcerative lesions, no rash or signs of infection present.  Sharp incurvated nail margin lateral border hallux left.  Erythema and edema with abscess present to granulation tissue is present no odor is present.  Vascular: Dorsalis Pedis artery and Posterior Tibial artery pedal pulses are 2/4 bilateral with immedate capillary fill time. Pedal hair growth present. No varicosities and no lower extremity edema present bilateral.   Neruologic: Grossly intact via light touch bilateral. Vibratory intact via tuning fork bilateral. Protective threshold with Semmes  Wienstein monofilament intact to all pedal sites bilateral. Patellar and Achilles deep tendon reflexes 2+ bilateral. No Babinski or clonus noted bilateral.   Musculoskeletal: No gross boney pedal deformities bilateral. No pain, crepitus, or limitation noted with foot and ankle range of motion bilateral. Muscular strength 5/5 in all groups tested bilateral.  Gait: Unassisted, Nonantalgic.    Radiographs:  None taken  Assessment & Plan:   Assessment: Ingrown toenail fibular border hallux left  Plan: Discussed etiology pathology conservative versus surgical therapies.  Chemical matricectomy was performed today after local anesthetic was administered.  The toe was prepped and draped as normal sterile fashion.  The nail was split from distal to proximal avulsed in toto exposing the mobic matrix.  I then applied phenol 30 seconds each x 3 was neutralized with isopropyl alcohol Silvadene and dry sterile compressive dressing was applied.  He was given both oral written home-going instructions for soaking of the toes was given but soaking instructions as well as Corticosporin otic prescription.  Follow-up with her in 2 weeks Marshia Tropea T. [place], [place]

## 2023-10-21 ENCOUNTER — Ambulatory Visit: Payer: Self-pay | Admitting: Podiatry

## 2023-10-30 ENCOUNTER — Ambulatory Visit: Payer: Self-pay | Admitting: Podiatry

## 2024-03-01 ENCOUNTER — Encounter (HOSPITAL_BASED_OUTPATIENT_CLINIC_OR_DEPARTMENT_OTHER): Payer: Self-pay

## 2024-03-01 ENCOUNTER — Ambulatory Visit (HOSPITAL_BASED_OUTPATIENT_CLINIC_OR_DEPARTMENT_OTHER): Payer: Self-pay

## 2024-03-01 VITALS — BP 144/81 | HR 94 | Wt 246.0 lb

## 2024-03-01 DIAGNOSIS — Z3480 Encounter for supervision of other normal pregnancy, unspecified trimester: Secondary | ICD-10-CM

## 2024-03-01 DIAGNOSIS — Z3201 Encounter for pregnancy test, result positive: Secondary | ICD-10-CM

## 2024-03-01 LAB — POCT URINE PREGNANCY: Preg Test, Ur: POSITIVE — AB

## 2024-03-01 MED ORDER — BLOOD PRESSURE KIT DEVI: 0 refills | Status: AC

## 2024-03-01 NOTE — Progress Notes (Unsigned)
 NURSE VISIT- PREGNANCY CONFIRMATION   SUBJECTIVE:  Samantha Hoffman is a 28 y.o. G66P1011 female at [redacted]w[redacted]d by ultrasound dating on 02/23/2024. No LMP recorded (lmp unknown). Patient is pregnant. Here for pregnancy confirmation.  Home pregnancy test: positive x 3. IVF 2 embryo transfer in [place]  on 01/20/2024. Has had confirmation with ultrasound in [place], [place] on 02/23/2024 which showed 1 embyro and 1 gestational sac. Patient will obtain records to provide to the office. She reports no complaints.  She is taking prenatal vitamins.   I explained I am completing New OB Intake today. We discussed EDD of 10/11/2024 based on ultrasound dating from 02/23/2024. I reviewed her allergies, medications and Medical/Surgical/OB history.    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   Acute pharyngitis 09/02/2012   Fever 09/02/2012   Headache 09/02/2012   Bipolar I disorder, single manic episode (HCC) 08/22/2012   Chills without fever 08/22/2012   Cough 08/22/2012   Insomnia 08/22/2012   Overdose of antipsychotic 05/25/2012   Prolonged Q-T interval on ECG 05/25/2012   Poor impulse control 05/25/2012   Oppositional defiant disorder 04/29/2011   Bipolar affective disorder, depressed, mild (HCC) 04/29/2011   GAD (generalized anxiety disorder) 04/24/2011   Abnormality of gait 05/23/2009   Asthma 05/15/2009   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 I explained pt will have some visits in office and some virtually. Babyscripts instructions given and order placed. Patient verifies receipt of registration text/e-mail. Account successfully created and app downloaded.   Blood Pressure Cuff/Weight Scale Patient has private  insurance; instructed to purchase blood pressure cuff and bring to first prenatal appt. Explained after first prenatal appt pt will check weekly and document in Babyscripts. Patient does have weight scale.  Is patient a candidate for Babyscripts Optimization? Yes  OBJECTIVE:  BP (!) 144/81 (BP Location: Right Arm, Patient Position: Sitting, Cuff Size: Large)   Pulse 94   Wt 246 lb (111.6 kg)   LMP  (LMP Unknown)   SpO2 100%   Breastfeeding No   BMI 35.30 kg/m   Appears well, in no apparent distress  Results for orders placed or performed in visit on 03/01/24 (from the past 24 hours)  POCT urine pregnancy   Collection Time: 03/01/24  3:26 PM  Result Value Ref Range   Preg Test, Ur Positive (A) Negative    ASSESSMENT: Positive pregnancy test in office.    PLAN: Prenatal vitamins: continue   Nausea medicines: not currently needed   New OB packet provided and reviewed with patient. Advised to return completed paperwork at new OB appointment. New OB appointment scheduled for 03/26/2024 at 10:55 am with Dr.Miller. Viability scan ordered and scheduled for 03/03/2024 at 11 am.

## 2024-03-03 ENCOUNTER — Telehealth (HOSPITAL_BASED_OUTPATIENT_CLINIC_OR_DEPARTMENT_OTHER): Payer: Self-pay

## 2024-03-03 ENCOUNTER — Other Ambulatory Visit (HOSPITAL_BASED_OUTPATIENT_CLINIC_OR_DEPARTMENT_OTHER)

## 2024-03-03 DIAGNOSIS — Z3687 Encounter for antenatal screening for uncertain dates: Secondary | ICD-10-CM | POA: Diagnosis not present

## 2024-03-03 DIAGNOSIS — Z3A08 8 weeks gestation of pregnancy: Secondary | ICD-10-CM

## 2024-03-03 DIAGNOSIS — Z3480 Encounter for supervision of other normal pregnancy, unspecified trimester: Secondary | ICD-10-CM

## 2024-03-03 NOTE — Telephone Encounter (Signed)
 Spoke with patient regarding her BP readings at her OV on 03/01/2024. Advised I have reviewed her BP readings with Arland Roller, CNM who recommends that she check her blood pressure at home for 3-5 consecutive days and see what the BP is running at home. Advised patient to send a mychart message with readings.Arland would also like her to take Aspirin 81mg  once daily. Patient states she is already taking Aspirin 81 mg daily.

## 2024-03-22 ENCOUNTER — Telehealth (HOSPITAL_BASED_OUTPATIENT_CLINIC_OR_DEPARTMENT_OTHER): Payer: Self-pay

## 2024-03-22 NOTE — Telephone Encounter (Signed)
 [place], Babyscripts  P [facility]-Babyscripts Htn Md; P [facility]-Ob/Gyn Clinical Babyscripts Trigger Notification for [place], [place] Trigger Severity: Elevated Blood Pressure Value: 159/95 Reading Date: 2024-03-22 Symptoms: No Symptoms

## 2024-03-22 NOTE — Telephone Encounter (Signed)
 Spoke with patient regarding BP entered into babyscripts. Patient retook her BP while on the phone and it is 162/81. Denies any headache, vision changes, nausea, vomiting, numbness/tingling, or upper right quadrant abdominal pain. Patient is not currently on BP medications. Taking aspirin 81 mg daily. Advised will review with Dr.Miller and return call with additional recommendations.  [place], Babyscripts  P [facility]-Babyscripts Htn Md; P [facility]-Ob/Gyn Clinical Babyscripts Trigger Notification for Samantha Hoffman, Samantha Hoffman Trigger Severity: Elevated Blood Pressure Value: 140/83 Reading Date: 2024-03-19 Symptoms: No Symptoms

## 2024-03-23 ENCOUNTER — Encounter (HOSPITAL_BASED_OUTPATIENT_CLINIC_OR_DEPARTMENT_OTHER): Payer: Self-pay

## 2024-03-23 ENCOUNTER — Ambulatory Visit (HOSPITAL_BASED_OUTPATIENT_CLINIC_OR_DEPARTMENT_OTHER)

## 2024-03-23 VITALS — BP 135/76 | HR 82 | Wt 255.0 lb

## 2024-03-23 DIAGNOSIS — Z0130 Encounter for examination of blood pressure without abnormal findings: Secondary | ICD-10-CM

## 2024-03-23 DIAGNOSIS — R519 Headache, unspecified: Secondary | ICD-10-CM

## 2024-03-23 NOTE — Progress Notes (Signed)
 NURSE VISIT- BLOOD PRESSURE CHECK  SUBJECTIVE:  Samantha Hoffman is a 29 y.o. G83P1011 female here for BP check. She is 100w1d pregnant . Reports intermittent headache over the last few days that began with a cold she had over the New Year's holiday.  HYPERTENSION ROS:  Pregnant/postpartum:  Severe headaches that don't go away with tylenol /other medicines: No  Visual changes (seeing spots/double/blurred vision) No  Severe pain under right breast breast or in center of upper chest No  Severe nausea/vomiting No  Taking medicines as instructed yes   OBJECTIVE:  BP 135/76 (BP Location: Right Arm, Patient Position: Sitting, Cuff Size: Large)   Pulse 82   Wt 255 lb (115.7 kg)   LMP  (LMP Unknown)   SpO2 100%   BMI 36.59 kg/m   Appearance alert, well appearing, and in no distress and oriented to person, place, and time.  ASSESSMENT: Pregnancy [redacted]w[redacted]d  blood pressure check.  PLAN: Recommendations: Continue monitoring BP at home and record in babyscripts. Advised it is okay to take Tylenol  1000 mg q6h prn for headache. Recommended adequate hydration. Follow-up: as scheduled on 03/26/2024 with Dr.Miller for New OB appointment.

## 2024-03-23 NOTE — Telephone Encounter (Signed)
 Spoke with patient. Nurse visit scheduled for today at 1:30 pm fro BP check.

## 2024-03-26 ENCOUNTER — Ambulatory Visit (INDEPENDENT_AMBULATORY_CARE_PROVIDER_SITE_OTHER): Payer: Self-pay | Admitting: Obstetrics & Gynecology

## 2024-03-26 ENCOUNTER — Other Ambulatory Visit (HOSPITAL_COMMUNITY)
Admission: RE | Admit: 2024-03-26 | Discharge: 2024-03-26 | Disposition: A | Source: Ambulatory Visit | Attending: Obstetrics & Gynecology | Admitting: Obstetrics & Gynecology

## 2024-03-26 ENCOUNTER — Encounter (HOSPITAL_BASED_OUTPATIENT_CLINIC_OR_DEPARTMENT_OTHER): Payer: Self-pay | Admitting: Obstetrics & Gynecology

## 2024-03-26 VITALS — BP 124/79 | HR 74 | Wt 256.2 lb

## 2024-03-26 DIAGNOSIS — Z3481 Encounter for supervision of other normal pregnancy, first trimester: Secondary | ICD-10-CM

## 2024-03-26 DIAGNOSIS — Z1332 Encounter for screening for maternal depression: Secondary | ICD-10-CM

## 2024-03-26 DIAGNOSIS — Z3480 Encounter for supervision of other normal pregnancy, unspecified trimester: Secondary | ICD-10-CM

## 2024-03-26 DIAGNOSIS — Z789 Other specified health status: Secondary | ICD-10-CM | POA: Insufficient documentation

## 2024-03-26 DIAGNOSIS — Z124 Encounter for screening for malignant neoplasm of cervix: Secondary | ICD-10-CM | POA: Insufficient documentation

## 2024-03-26 DIAGNOSIS — O09811 Supervision of pregnancy resulting from assisted reproductive technology, first trimester: Secondary | ICD-10-CM | POA: Diagnosis not present

## 2024-03-26 DIAGNOSIS — Z98891 History of uterine scar from previous surgery: Secondary | ICD-10-CM | POA: Insufficient documentation

## 2024-03-26 DIAGNOSIS — Z3A11 11 weeks gestation of pregnancy: Secondary | ICD-10-CM | POA: Insufficient documentation

## 2024-03-26 DIAGNOSIS — E032 Hypothyroidism due to medicaments and other exogenous substances: Secondary | ICD-10-CM

## 2024-03-26 DIAGNOSIS — R9431 Abnormal electrocardiogram [ECG] [EKG]: Secondary | ICD-10-CM

## 2024-03-26 NOTE — Progress Notes (Signed)
 "   History:   Samantha Hoffman is a 29 y.o. G3P1011 at [redacted]w[redacted]d by early ultrasound being seen today for her first obstetrical visit.  Her obstetrical history is significant for IVF pregnancy. Patient does intend to breast feed. Pregnancy history fully reviewed.  Patient reports no complaints.      HISTORY: OB History  Gravida Para Term Preterm AB Living  3 1 1  0 1 1  SAB IAB Ectopic Multiple Live Births  1 0 0 0 1    # Outcome Date GA Lbr Len/2nd Weight Sex Type Anes PTL Lv  3 Current           2 Term 07/16/21     CS-For   LIV  1 SAB 2023            Last pap smear was obtained today.  Past Medical History:  Diagnosis Date   Allergy    Anxiety    Bipolar 1 disorder (HCC)    Headache(784.0)    at times   Past Surgical History:  Procedure Laterality Date   CESAREAN SECTION     EYE SURGERY  At 6 months and 10 yearrs   pt mother states pt had straightening of eyes at 6months and 29yo   OVUM / OOCYTE RETRIEVAL     Family History  Problem Relation Age of Onset   Bipolar disorder Mother    Alcohol abuse Mother    Arthritis Mother    Depression Mother    Drug abuse Mother    Mental illness Mother    Miscarriages / Stillbirths Mother    Bipolar disorder Brother    Alcohol abuse Brother    Depression Brother    Mental illness Brother    Arthritis Maternal Grandmother    Heart disease Maternal Grandfather    Alcohol abuse Father    Bipolar disorder Father    Cancer Paternal Grandmother    Depression Maternal Aunt    Social History[1] Allergies[2] Medications Ordered Prior to Encounter[3]  Review of Systems Pertinent items noted in HPI and remainder of comprehensive ROS otherwise negative.  Physical Exam:   Vitals:   03/26/24 1123  BP: 124/79  Pulse: 74  Weight: 256 lb 3.2 oz (116.2 kg)   Fetal Heart Rate (bpm): 160  General: well-developed, well-nourished female in no acute distress  Breasts:  normal appearance, no masses or tenderness bilaterally  Skin:  normal coloration and turgor, no rashes  Neurologic: oriented, normal, negative, normal mood  Extremities: normal strength, tone, and muscle mass, ROM of all joints is normal  HEENT PERRLA, extraocular movement intact and sclera clear, anicteric  Neck supple and no masses  Cardiovascular: regular rate and rhythm  Respiratory:  no respiratory distress, normal breath sounds  Abdomen: soft, non-tender; bowel sounds normal; no masses,  no organomegaly  Pelvic: normal external genitalia, no lesions, normal vaginal mucosa, normal vaginal discharge, normal cervix, pap smear done.     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 Supervision of other normal pregnancy, antepartum (Primary) - on PNV - ABO/Rh; Future - Antibody screen; Future - CBC; Future - Hepatitis B surface antigen; Future - HIV Antibody (routine testing w rflx); Future -  HIV (Save tube for possible reflex); Future - RPR; Future - Rubella screen; Future - Hepatitis C antibody; Future - Cervicovaginal ancillary only - Culture, OB Urine - Hemoglobin A1c - Cytology - PAP( [place])  2. [redacted] weeks gestation of pregnancy - anatomy scan ordered  3. Cervical cancer screening - Cytology - PAP( [place])  4. IVF pregnancy  5.  Treated with low dosed thyroid medication with IVF -  she does not want to stop this as she used in prior pregnancy - also still on baby ASA and too through last pregnancy so does not want to stop as well - TSH  6.  History of C section   Initial labs drawn. Continue prenatal vitamins. Problem list reviewed and updated. Genetic Screening discussed, NIPS: requested. Ultrasound discussed; fetal anatomic survey:  requested. Anticipatory guidance about prenatal visits given including labs, ultrasounds, and testing. Discussed usage of Babyscripts and virtual visits as additional source of managing and completing prenatal visits in midst of coronavirus and pandemic.   The nature of [place] [place] [facility] with multiple MDs and Advanced Practice Providers was explained to patient; also emphasized that residents, students are part of our team. Routine obstetric precautions reviewed. Encouraged to seek out care at office or emergency room [facility] preferred) for urgent and/or emergent concerns. Return in about 4 weeks (around 04/23/2024).     Samantha [name], MD, FACOG Obstetrician & Gynecologist, [facility] for [facility], [facility]      [1]  Social History Tobacco Use   Smoking status: Never   Smokeless tobacco: Never  Vaping Use   Vaping status: Never Used  Substance Use Topics   Alcohol use: No   Drug use: No  [2]  Allergies Allergen Reactions   Bee Venom Anaphylaxis   Cephalosporins Hives   Azithromycin Hives   Cefprozil Hives   Doxycycline Hives   Iodinated Contrast Media    Penicillins Hives   Lamictal  Darcel.cuff ] Rash  [3]  Current Outpatient Medications on File Prior to Visit  Medication Sig Dispense Refill   aspirin EC 81 MG tablet Take 81 mg by mouth daily. Swallow whole.     prenatal vitamin w/FE, FA (PRENATAL 1 + 1) 27-1 MG TABS tablet Take 1 tablet by mouth daily at 12 noon.     SYNTHROID 25 MCG tablet Take 25 mcg by mouth daily.     Blood Pressure Monitoring (BLOOD PRESSURE KIT) DEVI Use as instructed to monitor blood pressure during pregnancy. 1 each 0   No current facility-administered medications on file prior to visit.   "

## 2024-03-27 ENCOUNTER — Ambulatory Visit (HOSPITAL_BASED_OUTPATIENT_CLINIC_OR_DEPARTMENT_OTHER): Payer: Self-pay | Admitting: Obstetrics & Gynecology

## 2024-03-27 LAB — HEMOGLOBIN A1C
Est. average glucose Bld gHb Est-mCnc: 97 mg/dL
Hgb A1c MFr Bld: 5 % (ref 4.8–5.6)

## 2024-03-28 LAB — URINE CULTURE, OB REFLEX

## 2024-03-28 LAB — CULTURE, OB URINE

## 2024-03-29 LAB — CYTOLOGY - PAP: Diagnosis: NEGATIVE

## 2024-03-29 LAB — CERVICOVAGINAL ANCILLARY ONLY
Chlamydia: NEGATIVE
Comment: NEGATIVE
Comment: NORMAL
Neisseria Gonorrhea: NEGATIVE

## 2024-04-03 LAB — PANORAMA PRENATAL TEST FULL PANEL:PANORAMA TEST PLUS 5 ADDITIONAL MICRODELETIONS: FETAL FRACTION: 2

## 2024-04-08 LAB — HORIZON CUSTOM: REPORT SUMMARY: NEGATIVE

## 2024-04-13 ENCOUNTER — Encounter (HOSPITAL_BASED_OUTPATIENT_CLINIC_OR_DEPARTMENT_OTHER): Payer: Self-pay | Admitting: Obstetrics & Gynecology

## 2024-04-14 ENCOUNTER — Encounter (HOSPITAL_BASED_OUTPATIENT_CLINIC_OR_DEPARTMENT_OTHER): Payer: Self-pay | Admitting: Obstetrics & Gynecology

## 2024-04-14 DIAGNOSIS — Z3402 Encounter for supervision of normal first pregnancy, second trimester: Secondary | ICD-10-CM | POA: Insufficient documentation

## 2024-04-22 ENCOUNTER — Ambulatory Visit (HOSPITAL_BASED_OUTPATIENT_CLINIC_OR_DEPARTMENT_OTHER): Payer: Self-pay | Admitting: Obstetrics and Gynecology

## 2024-04-22 ENCOUNTER — Encounter (HOSPITAL_BASED_OUTPATIENT_CLINIC_OR_DEPARTMENT_OTHER): Payer: Self-pay | Admitting: Obstetrics and Gynecology

## 2024-04-22 VITALS — BP 127/69 | HR 82 | Wt 263.0 lb

## 2024-04-22 DIAGNOSIS — E032 Hypothyroidism due to medicaments and other exogenous substances: Secondary | ICD-10-CM

## 2024-04-22 DIAGNOSIS — Z3480 Encounter for supervision of other normal pregnancy, unspecified trimester: Secondary | ICD-10-CM

## 2024-04-22 DIAGNOSIS — Z98891 History of uterine scar from previous surgery: Secondary | ICD-10-CM

## 2024-04-22 DIAGNOSIS — Z3A11 11 weeks gestation of pregnancy: Secondary | ICD-10-CM

## 2024-04-22 DIAGNOSIS — Z3402 Encounter for supervision of normal first pregnancy, second trimester: Secondary | ICD-10-CM

## 2024-04-22 DIAGNOSIS — Z3A15 15 weeks gestation of pregnancy: Secondary | ICD-10-CM

## 2024-04-22 DIAGNOSIS — Z3A16 16 weeks gestation of pregnancy: Secondary | ICD-10-CM

## 2024-04-22 DIAGNOSIS — Z789 Other specified health status: Secondary | ICD-10-CM

## 2024-04-22 NOTE — Progress Notes (Unsigned)
" ° °  PRENATAL VISIT NOTE  Subjective:  Samantha Hoffman is a 29 y.o. G3P1011 at 78w1dbeing seen today for ongoing prenatal care.  She is currently monitored for the following issues for this high-risk pregnancy and has Overdose of antipsychotic; Prolonged Q-T interval on ECG; Hypothyroidism due to medication; Conceived by in vitro fertilization; History of cesarean section; and Encounter for supervision of normal first pregnancy in second trimester on their problem list.  Patient reports no complaints.  Contractions: Not present. Vag. Bleeding: None.  Movement: Present. Denies leaking of fluid.   The following portions of the patient's history were reviewed and updated as appropriate: allergies, current medications, past family history, past medical history, past social history, past surgical history and problem list.   Objective:   Vitals:   04/22/24 1649  BP: 127/69  Pulse: 82  Weight: 263 lb (119.3 kg)    Fetal Status:  Fetal Heart Rate (bpm): 143   Movement: Present    General: Alert, oriented and cooperative. Patient is in no acute distress.  Skin: Skin is warm and dry. No rash noted.   Cardiovascular: Normal heart rate noted  Respiratory: Normal respiratory effort, no problems with respiration noted  Abdomen: Soft, gravid, appropriate for gestational age.  Pain/Pressure: Absent     Pelvic: Cervical exam deferred        Extremities: Normal range of motion.  Edema: None  Mental Status: Normal mood and affect. Normal behavior. Normal judgment and thought content.   Assessment and Plan:  Pregnancy: G3P1011 at [redacted]w[redacted]d 1. Encounter for supervision of normal first pregnancy in second trimester (Primary) BP and FHR normal   2. History of cesarean section Labored for 32 hours, did not progress past 4cm Desires RCS unless goes in to labor prior to 39 weeks   3. Conceived by in vitro fertilization Updated dating based on embryo transfer date at 6 weeks She's is trying to get records    4. Hypothyroidism due to medication Was taking synthroid for IVF Check tsh - TSH  5. [redacted] weeks gestation of pregnancy Missing initial prenatal labs Repeat panorama d/t low fetal fraction  - Hepatitis C antibody - Rubella screen - RPR - HIV (Save tube for possible reflex) - HIV Antibody (routine testing w rflx) - Hepatitis B surface antigen - CBC - ABO/Rh - Antibody screen - PANORAMA PRENATAL TEST    Preterm labor symptoms and general obstetric precautions including but not limited to vaginal bleeding, contractions, leaking of fluid and fetal movement were reviewed in detail with the patient. Please refer to After Visit Summary for other counseling recommendations.    Future Appointments  Date Time Provider Department Center  05/20/2024  3:55 PM Cleotilde Ronal RAMAN, MD [facility] [id number]  05/31/2024 10:00 AM [facility] PROVIDER 1 [facility]  05/31/2024 10:30 AM [facility] US3 [facility]  06/17/2024  9:15 AM Delores Nidia CROME, FNP [facility] [id number]  07/15/2024  9:55 AM Delores Nidia CROME, FNP [facility] [phone] [address]  07/29/2024  3:55 PM Cleotilde Ronal RAMAN, MD [facility] [phone] [address]    Nidia Delores, FNP "

## 2024-05-20 ENCOUNTER — Encounter (HOSPITAL_BASED_OUTPATIENT_CLINIC_OR_DEPARTMENT_OTHER): Payer: Self-pay | Admitting: Obstetrics & Gynecology

## 2024-05-31 ENCOUNTER — Other Ambulatory Visit

## 2024-05-31 ENCOUNTER — Ambulatory Visit

## 2024-06-17 ENCOUNTER — Encounter (HOSPITAL_BASED_OUTPATIENT_CLINIC_OR_DEPARTMENT_OTHER): Payer: Self-pay | Admitting: Obstetrics and Gynecology

## 2024-07-15 ENCOUNTER — Encounter (HOSPITAL_BASED_OUTPATIENT_CLINIC_OR_DEPARTMENT_OTHER): Payer: Self-pay | Admitting: Obstetrics and Gynecology

## 2024-07-29 ENCOUNTER — Encounter (HOSPITAL_BASED_OUTPATIENT_CLINIC_OR_DEPARTMENT_OTHER): Payer: Self-pay | Admitting: Obstetrics & Gynecology
